# Patient Record
Sex: Male | Born: 1975 | Race: Black or African American | Hispanic: No | Marital: Married | Smoking: Current every day smoker
Health system: Southern US, Community
[De-identification: ages and names within clinical notes are randomized; demographics above are authoritative.]

## PROBLEM LIST (undated history)

## (undated) ENCOUNTER — Ambulatory Visit (HOSPITAL_COMMUNITY): Admission: EM | Payer: Managed Care, Other (non HMO)

---

## 2004-11-02 ENCOUNTER — Emergency Department (HOSPITAL_COMMUNITY): Admission: EM | Admit: 2004-11-02 | Discharge: 2004-11-02 | Payer: Self-pay | Admitting: Emergency Medicine

## 2013-11-22 ENCOUNTER — Emergency Department (HOSPITAL_COMMUNITY): Payer: Managed Care, Other (non HMO)

## 2013-11-22 ENCOUNTER — Encounter (HOSPITAL_COMMUNITY): Payer: Self-pay | Admitting: Emergency Medicine

## 2013-11-22 ENCOUNTER — Emergency Department (HOSPITAL_COMMUNITY)
Admission: EM | Admit: 2013-11-22 | Discharge: 2013-11-22 | Disposition: A | Discharge status: Home / Self Care | Payer: Managed Care, Other (non HMO) | Attending: Emergency Medicine | Admitting: Emergency Medicine

## 2013-11-22 DIAGNOSIS — R0789 Other chest pain: Secondary | ICD-10-CM | POA: Insufficient documentation

## 2013-11-22 DIAGNOSIS — R079 Chest pain, unspecified: Secondary | ICD-10-CM | POA: Insufficient documentation

## 2013-11-22 DIAGNOSIS — F17200 Nicotine dependence, unspecified, uncomplicated: Secondary | ICD-10-CM | POA: Insufficient documentation

## 2013-11-22 DIAGNOSIS — Z791 Long term (current) use of non-steroidal anti-inflammatories (NSAID): Secondary | ICD-10-CM | POA: Insufficient documentation

## 2013-11-22 LAB — BASIC METABOLIC PANEL
ANION GAP: 11 (ref 5–15)
BUN: 16 mg/dL (ref 6–23)
CO2: 28 meq/L (ref 19–32)
Calcium: 9.7 mg/dL (ref 8.4–10.5)
Chloride: 104 mEq/L (ref 96–112)
Creatinine, Ser: 1.23 mg/dL (ref 0.50–1.35)
GFR calc non Af Amer: 75 mL/min — ABNORMAL LOW (ref >90)
GFR, EST AFRICAN AMERICAN: 87 mL/min — AB (ref >90)
Glucose, Bld: 126 mg/dL — ABNORMAL HIGH (ref 70–99)
POTASSIUM: 4 meq/L (ref 3.7–5.3)
SODIUM: 143 meq/L (ref 137–147)

## 2013-11-22 LAB — CBC
HCT: 41.7 % (ref 39.0–52.0)
Hemoglobin: 14.3 g/dL (ref 13.0–17.0)
MCH: 30.9 pg (ref 26.0–34.0)
MCHC: 34.3 g/dL (ref 30.0–36.0)
MCV: 90.1 fL (ref 78.0–100.0)
Platelets: 160 10*3/uL (ref 150–400)
RBC: 4.63 MIL/uL (ref 4.22–5.81)
RDW: 11.3 % — AB (ref 11.5–15.5)
WBC: 4.7 10*3/uL (ref 4.0–10.5)

## 2013-11-22 LAB — I-STAT TROPONIN, ED: TROPONIN I, POC: 0 ng/mL (ref 0.00–0.08)

## 2013-11-22 MED ORDER — NAPROXEN 500 MG PO TABS: 500.0000 mg | ORAL_TABLET | Freq: Two times a day (BID) | ORAL | Status: AC

## 2013-11-22 NOTE — ED Provider Notes (Signed)
CSN: [id number]     Arrival date & time 11/22/13  1401 History   First MD Initiated Contact with Patient 11/22/13 1717     Chief Complaint  Patient presents with  . Chest Pain     (Consider location/radiation/quality/duration/timing/severity/associated sxs/prior Treatment) HPI Comments: 38 year old male, no past medical history, currently smokes every day. He was at work today when he noticed the acute onset of a sharp and heavy chest pain in the middle of his chest. This was worse when he was lifting heavy boxes, better when he would rest. It was gone for an hour and a half during his lunch break but returned upon his return to work. At this time the patient is pain free, he has no shortness of breath cough, nausea, vomiting, fever, chills or sputum production. He has no peripheral edema, patient is very active has no exertional symptoms until today. He denies pleuritic chest pain. He denies family history of coronary disease. He does not have hypertension, diabetes or hypercholesterolemia.  Patient is a 38 y.o. male presenting with chest pain. The history is provided by the patient.  Chest Pain   History reviewed. No pertinent past medical history. History reviewed. No pertinent past surgical history. No family history on file. History  Substance Use Topics  . Smoking status: Current Every Day Smoker  . Smokeless tobacco: Not on file  . Alcohol Use: Yes    Review of Systems  Cardiovascular: Positive for chest pain.  All other systems reviewed and are negative.     Allergies  Review of patient's allergies indicates no known allergies.  Home Medications   Prior to Admission medications   Medication Sig Start Date End Date Taking? Authorizing Provider  acetaminophen (TYLENOL) 500 MG tablet Take 500 mg by mouth every 6 (six) hours as needed (pain.).   Yes Historical Provider, MD  Aspirin-Salicylamide-Caffeine (BC HEADACHE POWDER PO) Take 1 packet by mouth daily as needed  (headache.).   Yes Historical Provider, MD  diphenhydrAMINE (BENADRYL) 25 mg capsule Take 25 mg by mouth every 6 (six) hours as needed for sleep.   Yes Historical Provider, MD  ibuprofen (ADVIL,MOTRIN) 200 MG tablet Take 600 mg by mouth every 6 (six) hours as needed (pain.).   Yes Historical Provider, MD  naproxen (NAPROSYN) 500 MG tablet Take 1 tablet (500 mg total) by mouth 2 (two) times daily with a meal. 11/22/13   Vida RollerBrian D Mariely Mahr, MD   BP 132/77  Pulse 99  Temp(Src) 98.5 F (36.9 C) (Oral)  SpO2 99% Physical Exam  Nursing note and vitals reviewed. Constitutional: He appears well-developed and well-nourished. No distress.  HENT:  Head: Normocephalic and atraumatic.  Mouth/Throat: Oropharynx is clear and moist. No oropharyngeal exudate.  Eyes: Conjunctivae and EOM are normal. Pupils are equal, round, and reactive to light. Right eye exhibits no discharge. Left eye exhibits no discharge. No scleral icterus.  Neck: Normal range of motion. Neck supple. No JVD present. No thyromegaly present.  Cardiovascular: Normal rate, regular rhythm, normal heart sounds and intact distal pulses.  Exam reveals no gallop and no friction rub.   No murmur heard. Pulmonary/Chest: Effort normal and breath sounds normal. No respiratory distress. He has no wheezes. He has no rales.  Abdominal: Soft. Bowel sounds are normal. He exhibits no distension and no mass. There is no tenderness.  Musculoskeletal: Normal range of motion. He exhibits no edema and no tenderness.  Lymphadenopathy:    He has no cervical adenopathy.  Neurological: He is alert.  Coordination normal.  Skin: Skin is warm and dry. No rash noted. No erythema.  Psychiatric: He has a normal mood and affect. His behavior is normal.    ED Course  Procedures (including critical care time) Labs Review Labs Reviewed  CBC - Abnormal; Notable for the following:    RDW 11.3 (*)    All other components within normal limits  BASIC METABOLIC PANEL -  Abnormal; Notable for the following:    Glucose, Bld 126 (*)    GFR calc non Af Amer 75 (*)    GFR calc Af Amer 87 (*)    All other components within normal limits  I-STAT TROPOININ, ED    Imaging Review Dg Chest 2 View  11/22/2013   CLINICAL DATA:  Upper bilateral chest pain  EXAM: CHEST  2 VIEW  COMPARISON:  None.  FINDINGS: The heart size and mediastinal contours are within normal limits. Both lungs are clear. The visualized skeletal structures are unremarkable.  IMPRESSION: No active cardiopulmonary disease.   Electronically Signed   By: Christiana Pellant M.D.   On: 11/22/2013 18:49     EKG Interpretation   Date/Time:  Monday November 22 2013 14:13:59 EDT Ventricular Rate:  90 PR Interval:  192 QRS Duration: 84 QT Interval:  352 QTC Calculation: 430 R Axis:   62 Text Interpretation:  Normal sinus rhythm Possible Left atrial enlargement  Borderline ECG No old tracing to compare Confirmed by Desha Bitner  MD, Tyshauna Finkbiner  [phone]) on 11/22/2013 5:23:38 PM      MDM   Final diagnoses:  Other chest pain    The patient is well appearing with normal vital signs, EKG is unremarkable as well. The patient has no prior EKGs she elevate, he a chest x-ray to rule out pneumothorax.  X-ray negative, labs negative, patient will be given anti-inflammatories for home, appear stable for discharge.  Vida Roller, MD 11/22/13 Rosamaria Lints

## 2013-11-22 NOTE — ED Notes (Signed)
Pt c/o mid chest pain that statrted at 1000 this morning. Pt denies n/v, headache but states may have a little dizziness.

## 2013-11-22 NOTE — Discharge Instructions (Signed)
°Emergency Department Resource Guide °1) Find a Doctor and Pay Out of Pocket °Although you won't have to find out who is covered by your insurance plan, it is a good idea to ask around and get recommendations. You will then need to call the office and see if the doctor you have chosen will accept you as a new patient and what types of options they offer for patients who are self-pay. Some doctors offer discounts or will set up payment plans for their patients who do not have insurance, but you will need to ask so you aren't surprised when you get to your appointment. ° °2) Contact Your Local Health Department °Not all health departments have doctors that can see patients for sick visits, but many do, so it is worth a call to see if yours does. If you don't know where your local health department is, you can check in your phone book. The CDC also has a tool to help you locate your state's health department, and many state websites also have listings of all of their local health departments. ° °3) Find a Walk-in Clinic °If your illness is not likely to be very severe or complicated, you may want to try a walk in clinic. These are popping up all over the country in pharmacies, drugstores, and shopping centers. They're usually staffed by nurse practitioners or physician assistants that have been trained to treat common illnesses and complaints. They're usually fairly quick and inexpensive. However, if you have serious medical issues or chronic medical problems, these are probably not your best option. ° °No Primary Care Doctor: °- Call [facility] at  832-8000 - they can help you locate a primary care doctor that  accepts your insurance, provides certain services, etc. °- Physician Referral Service- 1-800-533-3463 ° °Chronic Pain Problems: °Organization         Address  Phone   Notes  °[facility]  (336) 297-2271 Patients need to be referred by their primary care doctor.  ° °Medication  Assistance: °Organization         Address  Phone   Notes  °[place] [facility] [address] °[place], [place] [address] (336) 641-8030 --Must be a resident of [place] °-- Must have NO insurance coverage whatsoever (no Medicaid/ Medicare, etc.) °-- The pt. MUST have a primary care doctor that directs their care regularly and follows them in the community °  °[facility]  (866) 331-1348   °[employer]  (888) 892-1162   ° °Agencies that provide inexpensive medical care: °Organization         Address  Phone   Notes  °[facility]  (336) 832-8035   °[facility]    (336) 832-7272   °[facility] [address] °[place],  [address] (336) 832-4777   °[facility] [address], °[place] (336) 271-4999   °[facility]    (336) 373-0678   °[facility]    (336) 272-1050   °[facility] ° [address], [place] Phone:  (336) 832-4444, Fax:  (336) 832-4440 Hours of Operation:  9 am - 6 pm, M-F.  Also accepts Medicaid/Medicare and self-pay.  °[facility] ° [address], [place] Phone: (336) 832-3150, Fax: (336) 832-3151. Hours of Operation:  8:30 am - 5:30 pm, M-F.  Also accepts Medicaid and self-pay.  °[facility] [place] [address], [place] Phone: (336) 878-6027   °[facility] [address], [place], [place] (336)723-1848, Ext. 123 Mondays & Thursdays: 7-9 AM.  First 15 patients are seen on a first come, first serve basis. °  ° °Medicaid-accepting [place] Providers: ° °Organization         Address  Phone   Notes  °[facility] [address], [place] (336) 641-2100 Also accepts self-pay patients.  °[facility] [address], [place] ° (336) 856-9996   °[facility] [address], [place]  (336) 288-8857   °[facility] [address] [place] [address], [place] (336) 299-7000   °Veita Bland [address], [place]  ° (336) 373-1557 Only accepts [place] Access Medicaid patients after they have their name applied to their card.  ° °Self-Pay (no insurance) in [place]: ° °Organization         Address  Phone   Notes  °Sickle Cell Patients, [facility] [address], [place] (336) 832-1970   °[facility] [address], [place] (336) 832-4400   °[facility] [place] ° [address], [place] (336) 992-4800   °[facility]/Dr. Osei-Bonsu ° [address] [place] [address], [place] or [address], [place] (336) 841-8500 Phone number for both [place] and [place] locations is the same.  °[facility] [address], [place] (336) 299-0000   °[facility] [address] [place] [address], [place] or [address] (336) 852-7530 °(336) 878-2260   °[facility] [address], [place] (336) 350-1642, phone; (336) 294-5005, fax Sees patients 1st and 3rd Saturday of every month.  Must not qualify for public or private insurance (i.e. Medicaid, Medicare, [place] Health Choice, Veterans' Benefits) • Household income should be no more than 200% of the poverty level •The clinic cannot treat you if you are pregnant or think you are pregnant • Sexually transmitted diseases are not treated at the clinic.  ° ° °Dental Care: °Organization         Address  Phone  Notes  °[place] [facility] [address], [place] (336) 641-6152 Accepts children up to age 21 who are enrolled in Medicaid or [place] Health Choice; pregnant women with a Medicaid card; and children who have applied for Medicaid or [place] Health Choice, but were declined, whose parents can pay a reduced fee at time of service.  °[place]  [facility] [place]  [address], [place] (336) 641-7733 Accepts children up to age 21 who are enrolled in Medicaid or [place] Health Choice; pregnant women with a Medicaid card; and children who have applied for Medicaid or [place] Health Choice, but were declined, whose parents can pay a reduced fee at time of service.  °[facility] ° [address], [place] (336) 641-4533 Patients are seen by appointment only. Walk-ins are not accepted. [facility] will see patients 18 years of age and older. °Monday - Tuesday (8am-5pm) °Most Wednesdays (8:30-5pm) °$30 per visit, cash only  °[facility] ° [address], [place] (336) 641-4533 Patients are seen by appointment only. Walk-ins are not accepted. [facility] will see patients 18 years of age and older. °One   Wednesday Evening (Monthly: Volunteer Based).  $30 per visit, cash only  °[facility]  (919) 537-3737 for adults; Children under age 4, call Graduate Pediatric Dentistry at (919) [phone]. Children aged 4-14, please call (919) [phone] to request a pediatric application. ° Dental services are provided in all areas of dental care including fillings, crowns and bridges, complete and partial dentures, implants, gum treatment, root canals, and extractions. Preventive care is also provided. Treatment is provided to both adults and children. °Patients are selected via a lottery and there is often a waiting list. °  °[facility] [address], °[place] ° (336) 763-8833 [email or web address] °  °[facility] [address], [place], [place] (336)723-1848, Ext. 123 Second and Fourth Thursday of each month, opens at 6:30 AM; Clinic ends at 9 AM.  Patients are seen on a first-come first-served basis, and a limited number are seen during each clinic.  ° °[facility] ° [address], [place], [place] (336) 723-7904    Eligibility Requirements °You must have lived in [place], [place], or [place] counties for at least the last three months. °  You cannot be eligible for state or federal sponsored healthcare insurance, including Veterans Administration, Medicaid, or Medicare. °  You generally cannot be eligible for healthcare insurance through your employer.  °  How to apply: °Eligibility screenings are held every Tuesday and Wednesday afternoon from 1:00 pm until 4:00 pm. You do not need an appointment for the interview!  °[facility] [address], [place], [place] 336-631-2330   °[facility]  336-342-8273   °[facility]  336-703-3100   °[facility]  336-570-6415   ° °Behavioral Health Resources in the Community: °Intensive Outpatient Programs °Organization         Address  Phone  Notes  °[facility] [address], [place], [place] 336-878-6098   °[facility] [address], [place], [place] 336-832-9800   °[facility] [address], [place], [place] ° 336-882-2125   °[facility] [address],  °[place], [place] 1-800-853-5163 or 336-641-4981   °Substance Abuse Resources °Organization         Address  Phone  Notes  °[facility]  336-882-2125   °[facility]  336-784-9470   °[facility]  336-285-9073   °[facility]  336-845-3988   °Residential & Outpatient Substance Abuse Program  1-800-659-3381   °Psychological Services °Organization         Address  Phone  Notes  °[facility]  336- 832-9600   °[facility]  336- 378-7881   °[facility] [address], [place] 1-800-853-5163 or 336-641-4981   ° °Mobile Crisis Teams °Organization         Address  Phone  Notes  °[facility], Mobile Crisis Care Unit  1-877-626-1772   °Assertive °[facility] ° [address]  [place], [place] 336-834-9664   °Sharon DeEsch [address] °[place] 336-554-5454   ° °Self-Help/Support Groups °Organization         Address  Phone             Notes  °[facility] - variety of support groups  336- 373-1402 Call for more information  °Narcotics Anonymous (NA), [facility] [address], °[place]   2 meetings at this location  ° °  Residential Treatment Programs °Organization         Address  Phone  Notes  °[facility] [address],    °[place]  1-866-801-8205   °[facility] ° [address], [place], [place] 704-293-8524   °[facility] [address], [place] 336-845-3988 Admissions: 8am-3pm M-F  °[facility] [address]    °[place], [place] 336-841-1104   °[facility] [address], [place], [place] 336-379-7146   °[facility] [address]  °[place], [place] 336-285-9073   °[facility] - Intensive Outpatient [address], [place], [place] 336-852-3033   °[facility] [address]  °[place], [place] 1-877-615-2722 or 336-784-9470   °[facility] [address] , [place] 336-227-7417 Accepts Medicaid  °[facility] [address]  ° [place] 1-800-659-3381 Substance Abuse/Addiction Treatment  ° °[place] Behavioral Health Resources °Organization         Address  Phone  Notes  °[facility]  (888) 581-9988   °Julie Brannon, PhD [address] [place], [place]   (336) 349-5553 or (336) 951-0000   °[facility]   [address] °[place], [place] (336) 349-4454   °[facility] [address], [place], [place] (336) 342-8316 Insurance/Medicaid/sponsorship through [facility]  °[facility] [address]                                    [place], [place] (336) 342-8316 Therapy/tele-psych/case    °[facility] [address]  ° [place], [place] (336) 349-2233    °Dr. Arfeen  (336) 349-4544   °[facility] [place]  [employer] [place] [facility] 1) [address], [place] °2) [address], [place] °3)  [address], [place] (336) 349-3220 °(336) 342-7768 ° °(336) 342-8140   °[place] [facility] (336) 342-1394 or (336) 342-3537 (After Hours)    ° ° °

## 2015-08-13 IMAGING — CR DG CHEST 2V
2 series · 2 of 2 positions shown · non-contrast
Comparison: None.

CLINICAL DATA: Upper bilateral chest pain

EXAM:
CHEST  2 VIEW

[w chest pa]
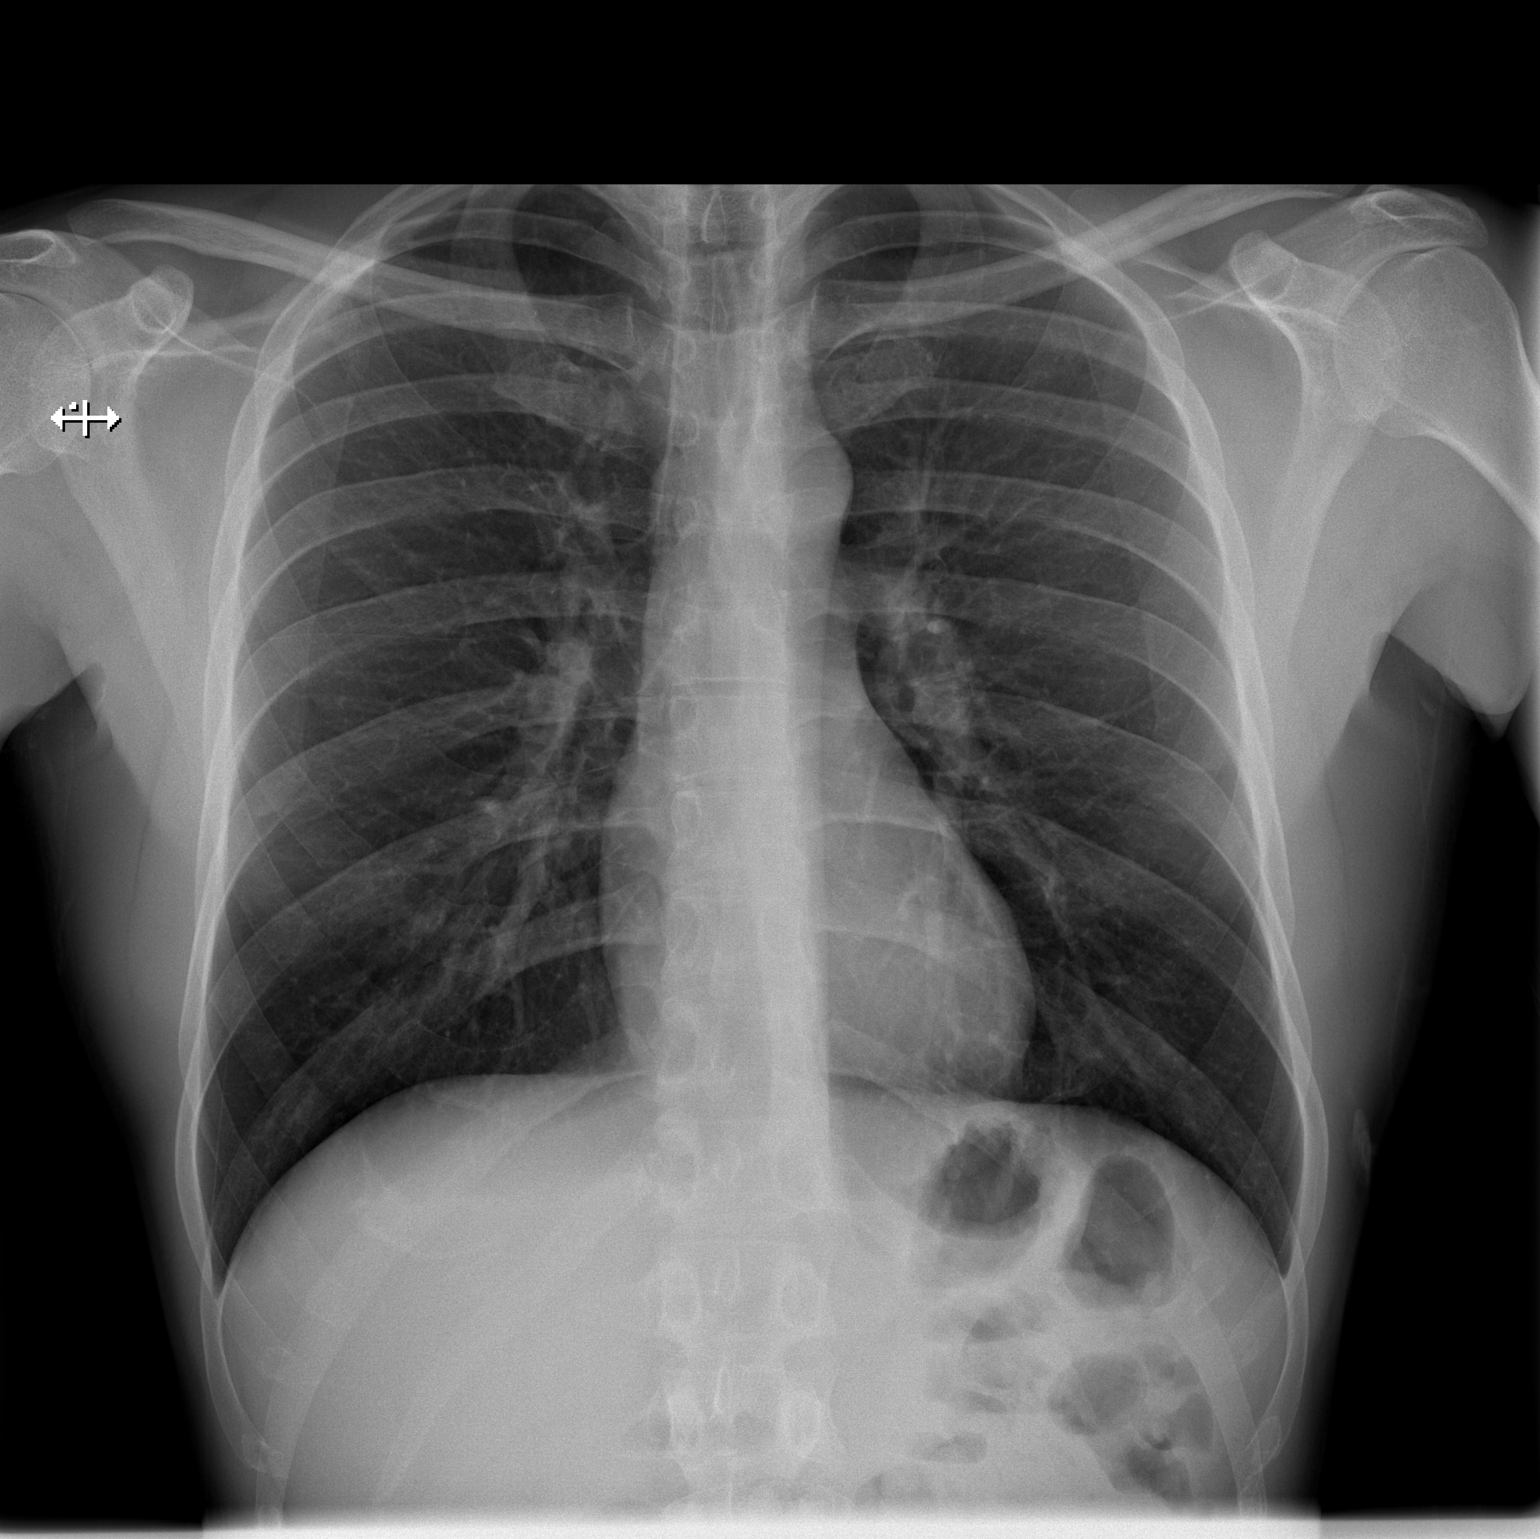

[w chest lat]
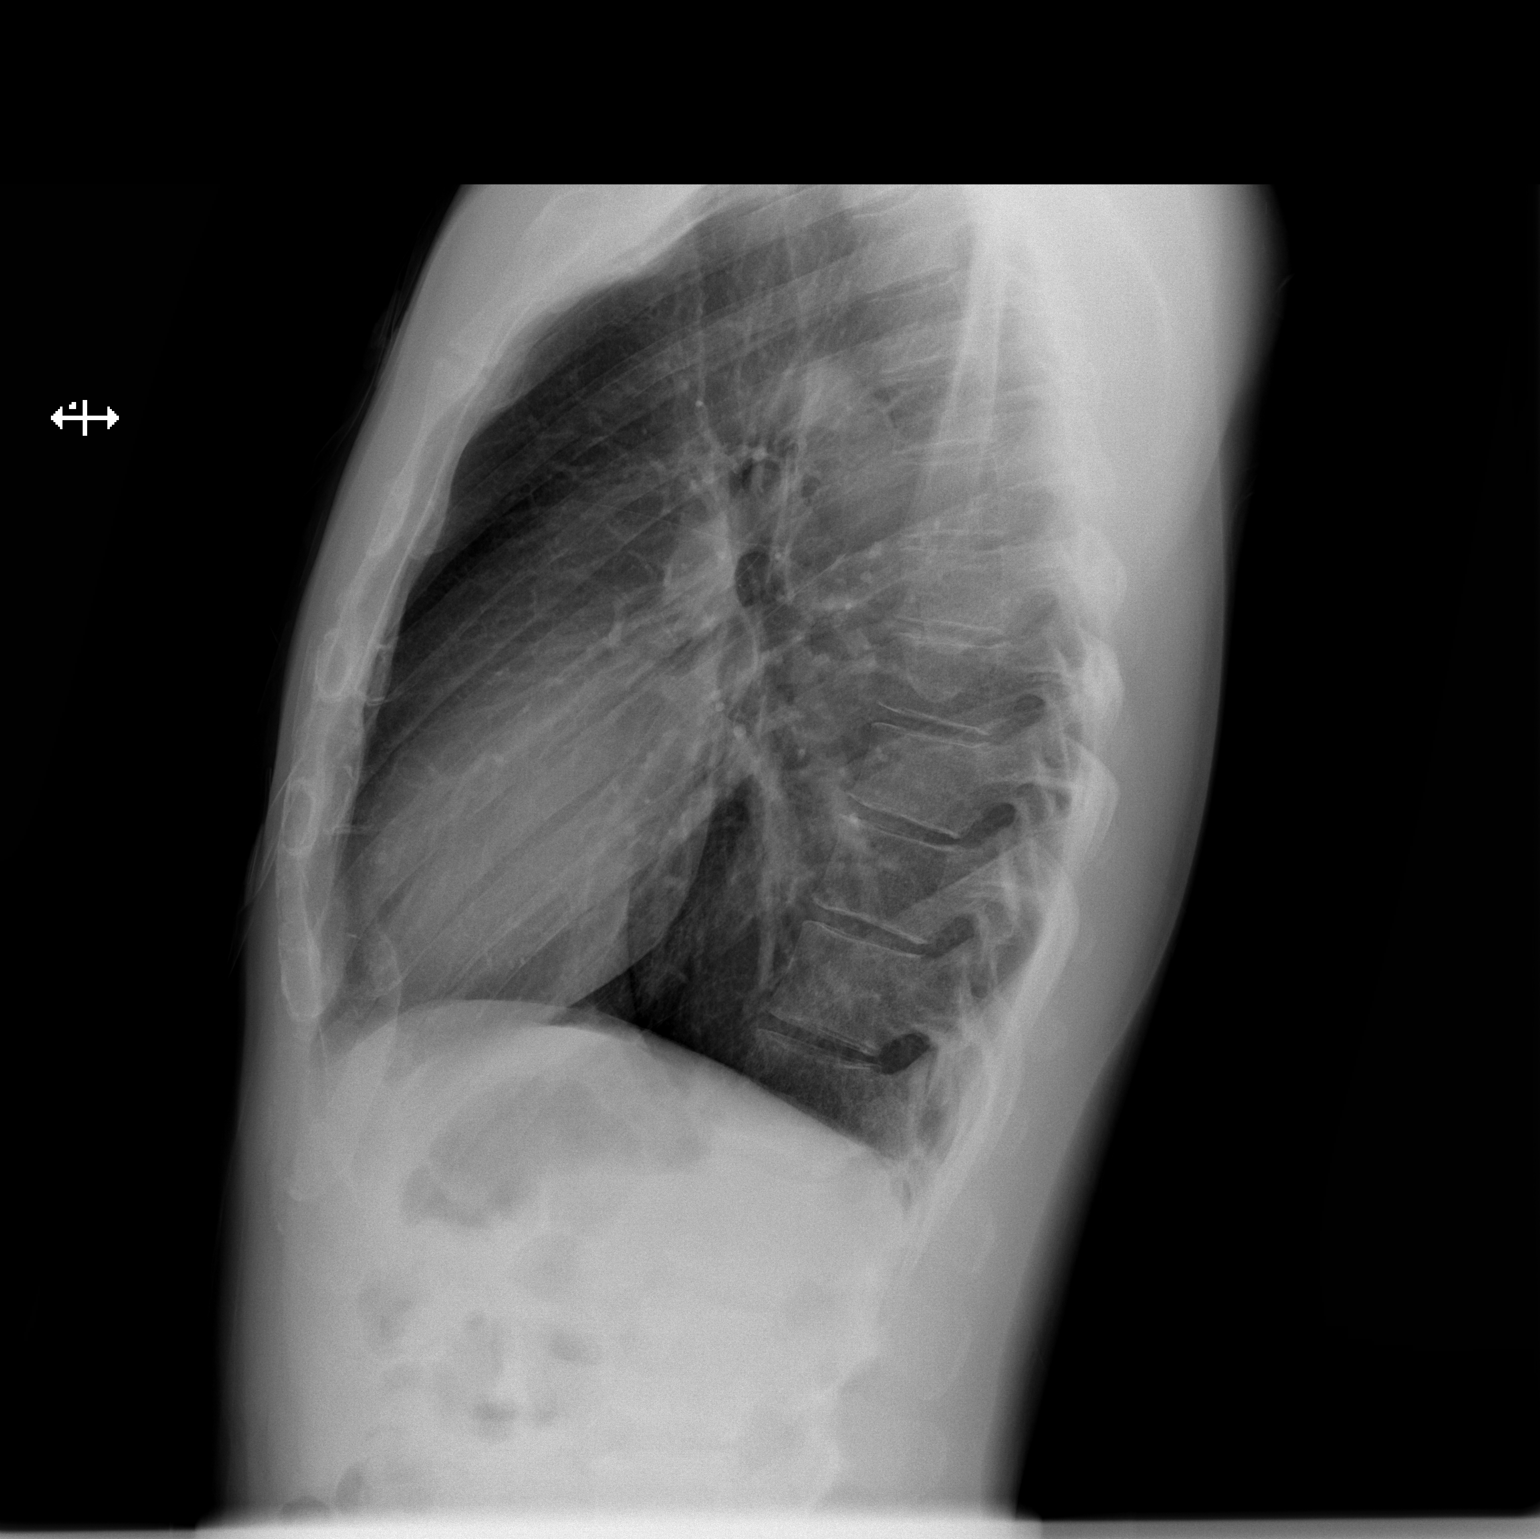

[2 of 2 positions shown; findings below may reference images not displayed]

FINDINGS: The heart size and mediastinal contours are within normal limits.
Both lungs are clear. The visualized skeletal structures are
unremarkable.
IMPRESSION: No active cardiopulmonary disease.
